# Patient Record
Sex: Male | Born: 1980 | Race: Black or African American | Hispanic: No | Marital: Single | Smoking: Current every day smoker
Health system: Southern US, Community
[De-identification: ages and names within clinical notes are randomized; demographics above are authoritative.]

---

## 2011-10-15 ENCOUNTER — Emergency Department (HOSPITAL_BASED_OUTPATIENT_CLINIC_OR_DEPARTMENT_OTHER)
Admission: EM | Admit: 2011-10-15 | Discharge: 2011-10-15 | Disposition: A | Discharge status: Home / Self Care | Payer: Self-pay | Attending: Emergency Medicine | Admitting: Emergency Medicine

## 2011-10-15 ENCOUNTER — Encounter (HOSPITAL_BASED_OUTPATIENT_CLINIC_OR_DEPARTMENT_OTHER): Payer: Self-pay

## 2011-10-15 ENCOUNTER — Emergency Department (INDEPENDENT_AMBULATORY_CARE_PROVIDER_SITE_OTHER): Payer: Self-pay

## 2011-10-15 DIAGNOSIS — R05 Cough: Secondary | ICD-10-CM | POA: Insufficient documentation

## 2011-10-15 DIAGNOSIS — R509 Fever, unspecified: Secondary | ICD-10-CM

## 2011-10-15 DIAGNOSIS — R220 Localized swelling, mass and lump, head: Secondary | ICD-10-CM | POA: Insufficient documentation

## 2011-10-15 DIAGNOSIS — R059 Cough, unspecified: Secondary | ICD-10-CM

## 2011-10-15 DIAGNOSIS — F17200 Nicotine dependence, unspecified, uncomplicated: Secondary | ICD-10-CM | POA: Insufficient documentation

## 2011-10-15 DIAGNOSIS — J3489 Other specified disorders of nose and nasal sinuses: Secondary | ICD-10-CM | POA: Insufficient documentation

## 2011-10-15 DIAGNOSIS — R221 Localized swelling, mass and lump, neck: Secondary | ICD-10-CM | POA: Insufficient documentation

## 2011-10-15 DIAGNOSIS — J069 Acute upper respiratory infection, unspecified: Secondary | ICD-10-CM | POA: Insufficient documentation

## 2011-10-15 MED ORDER — HYDROCODONE-ACETAMINOPHEN 7.5-500 MG/15ML PO SOLN: 15.0000 mL | Freq: Four times a day (QID) | ORAL | Status: AC | PRN

## 2011-10-15 MED ORDER — ALBUTEROL SULFATE HFA 108 (90 BASE) MCG/ACT IN AERS
2.0000 | INHALATION_SPRAY | RESPIRATORY_TRACT | Status: DC | PRN
  Administered 2011-10-15: 2 via RESPIRATORY_TRACT
  Filled 2011-10-15: qty 6.7

## 2011-10-15 NOTE — ED Notes (Signed)
Fever, prod cough x 2 weeks-smoker

## 2011-10-15 NOTE — ED Provider Notes (Signed)
History     CSN: [id number]  Arrival date & time 10/15/11  1217   First MD Initiated Contact with Patient 10/15/11 1227      Chief Complaint  Patient presents with  . Cough    (Consider location/radiation/quality/duration/timing/severity/associated sxs/prior treatment) Patient is a 31 y.o. male presenting with cough. The history is provided by the patient.  Cough This is a new problem. Episode onset: 2 weeks ago. The problem occurs constantly. The problem has been gradually improving. The cough is productive of sputum. Maximum temperature: Had a fever last week but since resolved. Associated symptoms include rhinorrhea. Pertinent negatives include no chest pain, no sweats, no ear pain, no sore throat, no shortness of breath and no wheezing. He has tried decongestants and cough syrup for the symptoms. The treatment provided no relief. He is a smoker. His past medical history does not include COPD or asthma.    History reviewed. No pertinent past medical history.  History reviewed. No pertinent past surgical history.  No family history on file.  History  Substance Use Topics  . Smoking status: Current Everyday Smoker  . Smokeless tobacco: Not on file  . Alcohol Use: No      Review of Systems  HENT: Positive for rhinorrhea. Negative for ear pain and sore throat.   Respiratory: Positive for cough. Negative for shortness of breath and wheezing.   Cardiovascular: Negative for chest pain.  All other systems reviewed and are negative.    Allergies  Review of patient's allergies indicates no known allergies.  Home Medications   Current Outpatient Rx  Name Route Sig Dispense Refill  . TYLENOL COLD MULTI-SYMPTOM PO Oral Take by mouth.      BP 125/75  Pulse 91  Temp(Src) 97.7 F (36.5 C) (Oral)  Resp 16  Ht 5\' 8"  (1.727 m)  Wt 125 lb (56.7 kg)  BMI 19.01 kg/m2  SpO2 100%  Physical Exam  Nursing note and vitals reviewed. Constitutional: He is oriented to person,  place, and time. He appears well-developed and well-nourished. No distress.  HENT:  Head: Normocephalic and atraumatic.  Nose: Mucosal edema and rhinorrhea present.  Mouth/Throat: Mucous membranes are normal. Posterior oropharyngeal erythema present. No oropharyngeal exudate or posterior oropharyngeal edema.  Eyes: Conjunctivae and EOM are normal. Pupils are equal, round, and reactive to light.  Neck: Normal range of motion. Neck supple.  Cardiovascular: Normal rate, regular rhythm and intact distal pulses.   No murmur heard. Pulmonary/Chest: Effort normal and breath sounds normal. No respiratory distress. He has no wheezes. He has no rales.  Abdominal: Soft. He exhibits no distension. There is no tenderness. There is no rebound and no guarding.  Musculoskeletal: Normal range of motion. He exhibits no edema and no tenderness.  Neurological: He is alert and oriented to person, place, and time.  Skin: Skin is warm and dry. No rash noted. No erythema.  Psychiatric: He has a normal mood and affect. His behavior is normal.    ED Course  Procedures (including critical care time)  Labs Reviewed - No data to display Dg Chest 2 View  10/15/2011  *RADIOLOGY REPORT*  Clinical Data: Fever and cough.  CHEST - 2 VIEW  Comparison:  None.  Findings:  The heart size and mediastinal contours are within normal limits.  Both lungs are clear.  The visualized skeletal structures are unremarkable.  IMPRESSION: No active cardiopulmonary disease.  Original Report Authenticated By: Danae Orleans, M.D.     No diagnosis found.  MDM   Pt with symptoms consistent with viral URI for last 2 weeks with persistent cough.  Well appearing here.  No signs of breathing difficulty  No signs of pharyngitis, otitis or abnormal abdominal findings.   CXR wnl and pt to return with any further problems.  Given inhaler for cough and no hx of seasonal allergies but will try zyrtec for nasal drainage.         Gwyneth Sprout, MD 10/15/11 1324

## 2012-04-20 ENCOUNTER — Encounter (HOSPITAL_BASED_OUTPATIENT_CLINIC_OR_DEPARTMENT_OTHER): Payer: Self-pay | Admitting: *Deleted

## 2012-04-20 ENCOUNTER — Emergency Department (HOSPITAL_BASED_OUTPATIENT_CLINIC_OR_DEPARTMENT_OTHER)
Admission: EM | Admit: 2012-04-20 | Discharge: 2012-04-20 | Disposition: A | Discharge status: Home Health | Payer: Self-pay | Attending: Emergency Medicine | Admitting: Emergency Medicine

## 2012-04-20 DIAGNOSIS — A6000 Herpesviral infection of urogenital system, unspecified: Secondary | ICD-10-CM | POA: Insufficient documentation

## 2012-04-20 DIAGNOSIS — B009 Herpesviral infection, unspecified: Secondary | ICD-10-CM

## 2012-04-20 DIAGNOSIS — F17200 Nicotine dependence, unspecified, uncomplicated: Secondary | ICD-10-CM | POA: Insufficient documentation

## 2012-04-20 MED ORDER — ACYCLOVIR 200 MG PO CAPS: 200.0000 mg | ORAL_CAPSULE | Freq: Every day | ORAL | Status: DC

## 2012-04-20 NOTE — ED Provider Notes (Signed)
History     CSN: [id number]  Arrival date & time 04/20/12  1500   First MD Initiated Contact with Patient 04/20/12 1545      Chief Complaint  Patient presents with  . Rash    (Consider location/radiation/quality/duration/timing/severity/associated sxs/prior treatment) Patient is a 31 y.o. male presenting with rash. The history is provided by the patient. No language interpreter was used.  Rash  This is a new problem. The current episode started more than 1 week ago. The problem has not changed (pt reports areas come and go) since onset.The problem is associated with nothing. There has been no fever. The rash is present on the genitalia. The pain is at a severity of 4/10. The patient is experiencing no pain. Associated symptoms include blisters.    History reviewed. No pertinent past medical history.  History reviewed. No pertinent past surgical history.  No family history on file.  History  Substance Use Topics  . Smoking status: Current Every Day Smoker -- 0.5 packs/day    Types: Cigarettes  . Smokeless tobacco: Not on file  . Alcohol Use: Yes      Review of Systems  Skin: Positive for rash.  All other systems reviewed and are negative.    Allergies  Review of patient's allergies indicates no known allergies.  Home Medications   Current Outpatient Rx  Name Route Sig Dispense Refill  . TYLENOL COLD MULTI-SYMPTOM PO Oral Take by mouth.      BP 123/76  Pulse 88  Temp 98.2 F (36.8 C) (Oral)  Resp 16  Ht 5\' 8"  (1.727 m)  Wt 125 lb (56.7 kg)  BMI 19.01 kg/m2  SpO2 100%  Physical Exam  Nursing note and vitals reviewed. Constitutional: He appears well-developed and well-nourished.  HENT:  Head: Normocephalic and atraumatic.  Genitourinary: Penile tenderness present.       Scattered blister, dried healed sore penis,  No discharge  Musculoskeletal: Normal range of motion.  Neurological: He is alert.  Skin: Skin is warm.  Psychiatric: He has a normal  mood and affect.    ED Course  Procedures (including critical care time)   Labs Reviewed  HERPES SIMPLEX VIRUS CULTURE  RPR   No results found.   No diagnosis found.    MDM  rpr ordered,   I suspect herpes,   Herpes cultured obtained        Lonia Skinner [place], [place] 04/20/12 1627

## 2012-04-20 NOTE — ED Notes (Signed)
States he has bumps on his "privates" that go away and come back.

## 2012-04-21 NOTE — ED Provider Notes (Signed)
Medical screening examination/treatment/procedure(s) were performed by non-physician practitioner and as supervising physician I was immediately available for consultation/collaboration.   Carleene Cooper III, MD 04/21/12 [phone]

## 2012-04-22 LAB — HERPES SIMPLEX VIRUS CULTURE: Special Requests: NORMAL

## 2012-04-23 NOTE — ED Notes (Signed)
+   Herpes-patient treated appropriately.

## 2012-04-24 NOTE — ED Notes (Signed)
Unable to contact via phone.'Letter sent to [facility] address. 

## 2012-05-07 ENCOUNTER — Emergency Department (HOSPITAL_BASED_OUTPATIENT_CLINIC_OR_DEPARTMENT_OTHER)
Admission: EM | Admit: 2012-05-07 | Discharge: 2012-05-07 | Disposition: A | Discharge status: Home / Self Care | Payer: Self-pay | Attending: Emergency Medicine | Admitting: Emergency Medicine

## 2012-05-07 ENCOUNTER — Encounter (HOSPITAL_BASED_OUTPATIENT_CLINIC_OR_DEPARTMENT_OTHER): Payer: Self-pay | Admitting: Emergency Medicine

## 2012-05-07 DIAGNOSIS — Z79899 Other long term (current) drug therapy: Secondary | ICD-10-CM | POA: Insufficient documentation

## 2012-05-07 DIAGNOSIS — K089 Disorder of teeth and supporting structures, unspecified: Secondary | ICD-10-CM | POA: Insufficient documentation

## 2012-05-07 DIAGNOSIS — K0889 Other specified disorders of teeth and supporting structures: Secondary | ICD-10-CM

## 2012-05-07 DIAGNOSIS — F17200 Nicotine dependence, unspecified, uncomplicated: Secondary | ICD-10-CM | POA: Insufficient documentation

## 2012-05-07 MED ORDER — HYDROCODONE-ACETAMINOPHEN 5-325 MG PO TABS: 1.0000 | ORAL_TABLET | ORAL | Status: DC | PRN

## 2012-05-07 MED ORDER — PENICILLIN V POTASSIUM 500 MG PO TABS: 500.0000 mg | ORAL_TABLET | Freq: Two times a day (BID) | ORAL | Status: AC

## 2012-05-07 MED ORDER — PENICILLIN V POTASSIUM 500 MG PO TABS: 500.0000 mg | ORAL_TABLET | Freq: Two times a day (BID) | ORAL | Status: DC

## 2012-05-07 MED ORDER — IBUPROFEN 600 MG PO TABS: 600.0000 mg | ORAL_TABLET | Freq: Four times a day (QID) | ORAL | Status: DC | PRN

## 2012-05-07 MED ORDER — HYDROCODONE-ACETAMINOPHEN 5-325 MG PO TABS
1.0000 | ORAL_TABLET | Freq: Once | ORAL | Status: AC
  Administered 2012-05-07: 1 via ORAL
  Filled 2012-05-07: qty 1

## 2012-05-07 NOTE — ED Notes (Signed)
Pt's prescriptions were sent to the [facility] pharmacy. Pt did not go to pharmacy to pick up rx until after pharmacy had close. Pt's rxs were re-printed and given to pt. Message left for pharmacist to cancel the rx they have there.

## 2012-05-07 NOTE — ED Provider Notes (Signed)
History     CSN: [id number]  Arrival date & time 05/07/12  4098   First MD Initiated Contact with Patient 05/07/12 1829      Chief Complaint  Patient presents with  . Dental Pain    (Consider location/radiation/quality/duration/timing/severity/associated sxs/prior treatment) HPI Pt with bl lower molar pain and gingival swelling worsening over the past few days. No fever, chills, neck pain. Pt has not seen dentist History reviewed. No pertinent past medical history.  History reviewed. No pertinent past surgical history.  No family history on file.  History  Substance Use Topics  . Smoking status: Current Every Day Smoker -- 0.5 packs/day    Types: Cigarettes  . Smokeless tobacco: Not on file  . Alcohol Use: Yes      Review of Systems  Constitutional: Negative for fever and chills.  HENT: Positive for dental problem. Negative for sore throat, facial swelling, trouble swallowing, neck pain and neck stiffness.     Allergies  Review of patient's allergies indicates no known allergies.  Home Medications   Current Outpatient Rx  Name  Route  Sig  Dispense  Refill  . ACYCLOVIR 200 MG PO CAPS   Oral   Take 1 capsule (200 mg total) by mouth 5 (five) times daily.   50 capsule   1   . HYDROCODONE-ACETAMINOPHEN 5-325 MG PO TABS   Oral   Take 1 tablet by mouth every 4 (four) hours as needed for pain.   10 tablet   0   . IBUPROFEN 600 MG PO TABS   Oral   Take 1 tablet (600 mg total) by mouth every 6 (six) hours as needed for pain.   30 tablet   0   . PENICILLIN V POTASSIUM 500 MG PO TABS   Oral   Take 1 tablet (500 mg total) by mouth 2 (two) times daily.   20 tablet   0   . TYLENOL COLD MULTI-SYMPTOM PO   Oral   Take by mouth.           BP 130/81  Pulse 79  Temp 98.4 F (36.9 C) (Oral)  Resp 16  Ht 5\' 8"  (1.727 m)  Wt 130 lb (58.968 kg)  BMI 19.77 kg/m2  SpO2 100%  Physical Exam  Nursing note and vitals reviewed. Constitutional: He is  oriented to person, place, and time. He appears well-developed and well-nourished. No distress.  HENT:  Head: Normocephalic and atraumatic.  Mouth/Throat: Oropharynx is clear and moist.    Eyes: EOM are normal. Pupils are equal, round, and reactive to light.  Neck: Normal range of motion. Neck supple.  Pulmonary/Chest: Effort normal.  Abdominal: Soft. Bowel sounds are normal.  Musculoskeletal: Normal range of motion. He exhibits no edema and no tenderness.  Lymphadenopathy:    He has no cervical adenopathy.  Neurological: He is alert and oriented to person, place, and time.  Skin: Skin is warm and dry. No rash noted. No erythema.  Psychiatric: He has a normal mood and affect. His behavior is normal.    ED Course  Procedures (including critical care time)  Labs Reviewed - No data to display No results found.   1. Pain, dental       MDM  No definite abscess present. F/u with dentist.         Loren Racer, MD 05/07/12 [phone]

## 2012-05-07 NOTE — ED Notes (Signed)
Broken teeth on bottom, both sides.

## 2012-07-02 ENCOUNTER — Emergency Department (HOSPITAL_BASED_OUTPATIENT_CLINIC_OR_DEPARTMENT_OTHER)
Admission: EM | Admit: 2012-07-02 | Discharge: 2012-07-02 | Disposition: A | Discharge status: Home / Self Care | Payer: Self-pay | Attending: Emergency Medicine | Admitting: Emergency Medicine

## 2012-07-02 ENCOUNTER — Encounter (HOSPITAL_BASED_OUTPATIENT_CLINIC_OR_DEPARTMENT_OTHER): Payer: Self-pay | Admitting: *Deleted

## 2012-07-02 DIAGNOSIS — F17200 Nicotine dependence, unspecified, uncomplicated: Secondary | ICD-10-CM | POA: Insufficient documentation

## 2012-07-02 DIAGNOSIS — Z79899 Other long term (current) drug therapy: Secondary | ICD-10-CM | POA: Insufficient documentation

## 2012-07-02 DIAGNOSIS — A6000 Herpesviral infection of urogenital system, unspecified: Secondary | ICD-10-CM

## 2012-07-02 MED ORDER — ACYCLOVIR 800 MG PO TABS: 800.0000 mg | ORAL_TABLET | Freq: Two times a day (BID) | ORAL | Status: AC

## 2012-07-02 NOTE — ED Notes (Signed)
Recurrent lesions on penis has prescription of acyclovir wants refilled had filled 06/05/2012

## 2012-07-02 NOTE — ED Provider Notes (Signed)
History     CSN: [id number]  Arrival date & time 07/02/12  4098   First MD Initiated Contact with Patient 07/02/12 0848      Chief Complaint  Patient presents with  . Rash    (Consider location/radiation/quality/duration/timing/severity/associated sxs/prior treatment) HPI Pt with history of herpes genitalis reports outbreak for the last 2 days with sores on shaft of penis and suprapubic area. He has previously taken acyclovir with good results. Denies penile discharge.   History reviewed. No pertinent past medical history.  History reviewed. No pertinent past surgical history.  History reviewed. No pertinent family history.  History  Substance Use Topics  . Smoking status: Current Every Day Smoker -- 0.5 packs/day    Types: Cigarettes  . Smokeless tobacco: Not on file  . Alcohol Use: Yes      Review of Systems All other systems reviewed and are negative except as noted in HPI.   Allergies  Review of patient's allergies indicates no known allergies.  Home Medications   Current Outpatient Rx  Name  Route  Sig  Dispense  Refill  . ACYCLOVIR 200 MG PO CAPS   Oral   Take 1 capsule (200 mg total) by mouth 5 (five) times daily.   50 capsule   1   . HYDROCODONE-ACETAMINOPHEN 5-325 MG PO TABS   Oral   Take 1 tablet by mouth every 4 (four) hours as needed for pain.   10 tablet   0   . IBUPROFEN 600 MG PO TABS   Oral   Take 1 tablet (600 mg total) by mouth every 6 (six) hours as needed for pain.   30 tablet   0   . TYLENOL COLD MULTI-SYMPTOM PO   Oral   Take by mouth.           BP 129/77  Pulse 70  Temp 97.8 F (36.6 C) (Oral)  Resp 16  Ht 5\' 8"  (1.727 m)  Wt 130 lb (58.968 kg)  BMI 19.77 kg/m2  SpO2 99%  Physical Exam  Nursing note and vitals reviewed. Constitutional: He is oriented to person, place, and time. He appears well-developed and well-nourished.  HENT:  Head: Normocephalic and atraumatic.  Eyes: EOM are normal. Pupils are equal,  round, and reactive to light.  Neck: Normal range of motion. Neck supple.  Cardiovascular: Normal rate, normal heart sounds and intact distal pulses.   Pulmonary/Chest: Effort normal and breath sounds normal.  Abdominal: Bowel sounds are normal. He exhibits no distension. There is no tenderness.  Genitourinary:       Small area of herpetic rash on L shaft and pubic region, no discharge; no testicular tenderness/swelling  Musculoskeletal: Normal range of motion. He exhibits no edema and no tenderness.  Neurological: He is alert and oriented to person, place, and time. He has normal strength. No cranial nerve deficit or sensory deficit.  Skin: Skin is warm and dry. No rash noted.  Psychiatric: He has a normal mood and affect.    ED Course  Procedures (including critical care time)  Labs Reviewed - No data to display No results found.   No diagnosis found.    MDM  Acyclovir refill, advised safe sex practices        Odella Appelhans B. Bernette Mayers, MD 07/02/12 [phone]

## 2014-07-08 ENCOUNTER — Encounter (HOSPITAL_BASED_OUTPATIENT_CLINIC_OR_DEPARTMENT_OTHER): Payer: Self-pay | Admitting: Emergency Medicine

## 2014-07-08 ENCOUNTER — Emergency Department (HOSPITAL_BASED_OUTPATIENT_CLINIC_OR_DEPARTMENT_OTHER)
Admission: EM | Admit: 2014-07-08 | Discharge: 2014-07-08 | Disposition: A | Discharge status: Home / Self Care | Payer: Self-pay | Attending: Emergency Medicine | Admitting: Emergency Medicine

## 2014-07-08 DIAGNOSIS — D170 Benign lipomatous neoplasm of skin and subcutaneous tissue of head, face and neck: Secondary | ICD-10-CM | POA: Insufficient documentation

## 2014-07-08 DIAGNOSIS — Z79899 Other long term (current) drug therapy: Secondary | ICD-10-CM | POA: Insufficient documentation

## 2014-07-08 DIAGNOSIS — Z720 Tobacco use: Secondary | ICD-10-CM | POA: Insufficient documentation

## 2014-07-08 MED ORDER — SULFAMETHOXAZOLE-TRIMETHOPRIM 800-160 MG PO TABS: 1.0000 | ORAL_TABLET | Freq: Two times a day (BID) | ORAL | Status: AC

## 2014-07-08 NOTE — ED Provider Notes (Signed)
CSN: [id number]     Arrival date & time 07/08/14  0930 History   First MD Initiated Contact with Patient 07/08/14 0957     Chief Complaint  Patient presents with  . Skin Problem     (Consider location/radiation/quality/duration/timing/severity/associated sxs/prior Treatment) HPI Comments: Patient is a 34 year old male who presents with complaints of swelling to his forehead.he noticed a large area of swelling to the right side of his forehead one month ago that occurred in the absence of any injury or trauma. This has since become smaller in size but is noticeable. He is having no pain or discomfort, but is concerned about the cosmetic appearance. He denies fevers or chills. He denies any other complaint.  The history is provided by the patient.    No past medical history on file. No past surgical history on file. No family history on file. History  Substance Use Topics  . Smoking status: Current Every Day Smoker -- 0.50 packs/day    Types: Cigarettes  . Smokeless tobacco: Not on file  . Alcohol Use: Yes    Review of Systems  All other systems reviewed and are negative.     Allergies  Review of patient's allergies indicates no known allergies.  Home Medications   Prior to Admission medications   Medication Sig Start Date End Date Taking? Authorizing Provider  acyclovir (ZOVIRAX) 200 MG capsule Take 1 capsule (200 mg total) by mouth 5 (five) times daily. 04/20/12   Fransico Meadow, PA-C  HYDROcodone-acetaminophen (NORCO/VICODIN) 5-325 MG per tablet Take 1 tablet by mouth every 4 (four) hours as needed for pain. 05/07/12   Fransico Meadow, PA-C  ibuprofen (ADVIL,MOTRIN) 600 MG tablet Take 1 tablet (600 mg total) by mouth every 6 (six) hours as needed for pain. 05/07/12   Fransico Meadow, PA-C  Phenyleph-CPM-DM-APAP (TYLENOL COLD MULTI-SYMPTOM PO) Take by mouth.    Historical Provider, MD   BP 123/82 mmHg  Pulse 67  Temp(Src) 98.2 F (36.8 C) (Oral)  Resp 16  Ht 5\' 8"   (1.727 m)  Wt 128 lb (58.06 kg)  BMI 19.47 kg/m2  SpO2 100% Physical Exam  Constitutional: He is oriented to person, place, and time. He appears well-developed and well-nourished. No distress.  HENT:  Head: Normocephalic and atraumatic.  There is a 1 cm round rubbery lesion to the right mid forehead. This is freely mobile within the soft tissues and not fixed.  Eyes: EOM are normal. Pupils are equal, round, and reactive to light.  Neck: Normal range of motion. Neck supple.  Neurological: He is alert and oriented to person, place, and time.  Skin: Skin is warm and dry. He is not diaphoretic.  Nursing note and vitals reviewed.   ED Course  Procedures (including critical care time) Labs Review Labs Reviewed - No data to display  Imaging Review No results found.   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 This will be treated with Bactrim and when necessary follow-up with [facility] [place] [facility] if not improving.    Veryl Speak, MD 07/08/14 1012

## 2014-07-08 NOTE — Discharge Instructions (Signed)
Bactrim as prescribed.  If this is not improving in the next week, follow-up with [facility] to discuss removal of the lesion.   Lipoma A lipoma is a noncancerous (benign) tumor composed of fat cells. They are usually found under the skin (subcutaneous). A lipoma may occur in any tissue of the body that contains fat. Common areas for lipomas to appear include the back, shoulders, buttocks, and thighs. Lipomas are a very common soft tissue growth. They are soft and grow slowly. Most problems caused by a lipoma depend on where it is growing. DIAGNOSIS  A lipoma can be diagnosed with a physical exam. These tumors rarely become cancerous, but radiographic studies can help determine this for certain. Studies used may include:  Computerized X-ray scans (CT or CAT scan).  Computerized magnetic scans (MRI). TREATMENT  Small lipomas that are not causing problems may be watched. If a lipoma continues to enlarge or causes problems, removal is often the best treatment. Lipomas can also be removed to improve appearance. Surgery is done to remove the fatty cells and the surrounding capsule. Most often, this is done with medicine that numbs the area (local anesthetic). The removed tissue is examined under a microscope to make sure it is not cancerous. Keep all follow-up appointments with your caregiver. SEEK MEDICAL CARE IF:   The lipoma becomes larger or hard.  The lipoma becomes painful, red, or increasingly swollen. These could be signs of infection or a more serious condition. Document Released: 05/31/2002 Document Revised: 09/02/2011 Document Reviewed: 11/10/2009 [facility] Patient Information 2015 [place], [place]. This information is not intended to replace advice given to you by your health care provider. Make sure you discuss any questions you have with your health care provider.

## 2014-07-08 NOTE — ED Notes (Signed)
Pt has small nodule under skin on forehead for over 1 month.  Pt denies pain.  Nodule is movable.

## 2014-07-27 ENCOUNTER — Encounter (HOSPITAL_BASED_OUTPATIENT_CLINIC_OR_DEPARTMENT_OTHER): Payer: Self-pay | Admitting: *Deleted

## 2014-07-27 ENCOUNTER — Emergency Department (HOSPITAL_BASED_OUTPATIENT_CLINIC_OR_DEPARTMENT_OTHER)
Admission: EM | Admit: 2014-07-27 | Discharge: 2014-07-27 | Disposition: A | Discharge status: Home / Self Care | Payer: Self-pay | Attending: Emergency Medicine | Admitting: Emergency Medicine

## 2014-07-27 DIAGNOSIS — Z720 Tobacco use: Secondary | ICD-10-CM | POA: Insufficient documentation

## 2014-07-27 DIAGNOSIS — R51 Headache: Secondary | ICD-10-CM | POA: Insufficient documentation

## 2014-07-27 DIAGNOSIS — K0889 Other specified disorders of teeth and supporting structures: Secondary | ICD-10-CM

## 2014-07-27 DIAGNOSIS — K088 Other specified disorders of teeth and supporting structures: Secondary | ICD-10-CM | POA: Insufficient documentation

## 2014-07-27 MED ORDER — OXYCODONE-ACETAMINOPHEN 5-325 MG PO TABS: 1.0000 | ORAL_TABLET | Freq: Four times a day (QID) | ORAL | Status: DC | PRN

## 2014-07-27 MED ORDER — PENICILLIN V POTASSIUM 500 MG PO TABS: 500.0000 mg | ORAL_TABLET | Freq: Three times a day (TID) | ORAL | Status: DC

## 2014-07-27 NOTE — ED Provider Notes (Signed)
CSN: [id number]     Arrival date & time 07/27/14  1829 History  This chart was scribed for John Beards, MD by Tula Nakayama, ED Scribe. This patient was seen in room MH01/MH01 and the patient's care was started at 6:25 PM.    Chief Complaint  Patient presents with  . Dental Pain    Patient is a 34 y.o. male presenting with tooth pain. The history is provided by the patient. No language interpreter was used.  Dental Pain Location:  Upper Upper teeth location: pre-molar. Quality:  Unable to specify Severity:  Moderate Onset quality:  Gradual Duration:  1 day Timing:  Constant Progression:  Unchanged Chronicity:  New Context: not trauma   Relieved by:  Nothing Worsened by:  Pressure Ineffective treatments:  NSAIDs Associated symptoms: facial swelling and headaches   Associated symptoms: no fever     HPI Comments: Evertt Chouinard is a 34 y.o. male with no chronic medical history who presents to the Emergency Department complaining of constant, moderate left upper dental pain that started yesterday. Pt states left-sided buccal swelling and HA as associated symptoms. He has tried Ibuprofen with no relief. Pt denies recent trauma or injury.  He states that he typically has to chew on his right side because he has teeth on his left side that need to be pulled. Pt denies fever as an associated symptom.  History reviewed. No pertinent past medical history. History reviewed. No pertinent past surgical history. History reviewed. No pertinent family history. History  Substance Use Topics  . Smoking status: Current Every Day Smoker -- 0.50 packs/day    Types: Cigarettes  . Smokeless tobacco: Not on file  . Alcohol Use: Yes    Review of Systems  Constitutional: Negative for fever.  HENT: Positive for dental problem and facial swelling.   Neurological: Positive for headaches.   Allergies  Review of patient's allergies indicates no known allergies.  Home Medications   Prior to  Admission medications   Medication Sig Start Date End Date Taking? Authorizing Provider  oxyCODONE-acetaminophen (PERCOCET/ROXICET) 5-325 MG per tablet Take 1-2 tablets by mouth every 6 (six) hours as needed for severe pain. 07/27/14   John Beards, MD  penicillin v potassium (VEETID) 500 MG tablet Take 1 tablet (500 mg total) by mouth 3 (three) times daily. 07/27/14   John Beards, MD   BP 124/83 mmHg  Pulse 76  Temp(Src) 98.6 F (37 C)  Resp 18  SpO2 100%   Physical Exam  Constitutional: He appears well-developed and well-nourished. No distress.  HENT:  Head: Normocephalic and atraumatic.  Mild gingival redness and swelling of left upper premolar; no significant lymphadenopathy  Eyes: Conjunctivae and EOM are normal.  Neck: Neck supple. No tracheal deviation present.  Cardiovascular: Normal rate, regular rhythm and normal heart sounds.   Pulmonary/Chest: Effort normal and breath sounds normal. No respiratory distress.  Lungs clear bilaterally  Lymphadenopathy:    He has no cervical adenopathy.  Skin: Skin is warm and dry.  Psychiatric: He has a normal mood and affect. His behavior is normal.  Nursing note and vitals reviewed.   ED Course  Procedures (including critical care time) DIAGNOSTIC STUDIES: Oxygen Saturation is 100% on RA, normal by my interpretation.    COORDINATION OF CARE: 6:40 PM Discussed treatment plan with pt at bedside and pt agreed to plan. Advised pt to follow up with dentist.  Labs Review Labs Reviewed - No data to display  Imaging Review No results  found.   EKG Interpretation None      MDM   Final diagnoses:  Pain, dental   Pt presenting with pain in left upper gumline/premolar, some swelling and erythema noted.  No swelling under tongue.  Pt started on abx and pain medication, he was given information for dental followup and encouraged to call tomorrow morning.  Discharged with strict return precautions.  Pt agreeable with plan.   I  personally performed the services described in this documentation, which was scribed in my presence. The recorded information has been reviewed and is accurate.     John Beards, MD 07/27/14 [place]

## 2014-07-27 NOTE — ED Notes (Signed)
Pt c/o dental pain x 2 days.  

## 2014-07-27 NOTE — Discharge Instructions (Signed)
Return to the ED with any concerns including increased pain, vomiting and not able to keep down antibiotics, difficulty breathing or swallowing, decreased level of alertness/lethargy, or any other alarming symptoms

## 2015-08-09 ENCOUNTER — Emergency Department (HOSPITAL_BASED_OUTPATIENT_CLINIC_OR_DEPARTMENT_OTHER)
Admission: EM | Admit: 2015-08-09 | Discharge: 2015-08-09 | Disposition: A | Discharge status: Home / Self Care | Payer: Self-pay | Attending: Emergency Medicine | Admitting: Emergency Medicine

## 2015-08-09 ENCOUNTER — Encounter (HOSPITAL_BASED_OUTPATIENT_CLINIC_OR_DEPARTMENT_OTHER): Payer: Self-pay | Admitting: *Deleted

## 2015-08-09 DIAGNOSIS — F17210 Nicotine dependence, cigarettes, uncomplicated: Secondary | ICD-10-CM | POA: Insufficient documentation

## 2015-08-09 DIAGNOSIS — Z880 Allergy status to penicillin: Secondary | ICD-10-CM | POA: Insufficient documentation

## 2015-08-09 DIAGNOSIS — R220 Localized swelling, mass and lump, head: Secondary | ICD-10-CM | POA: Insufficient documentation

## 2015-08-09 NOTE — ED Notes (Signed)
States he has a knot on head he was seen here about a year ago and went for follow up recently and was told to come back here. Swelling is on right forehead. Dx a year ago with a fatty tissue area and placed on prednisone but it did not help. No pain.

## 2015-08-09 NOTE — Discharge Instructions (Signed)
You have been seen today for a scalp mass. Follow-up with plastic surgery for removal as soon as possible. Call their office to make an appointment. Follow up with PCP as needed. Return to ED should symptoms worsen.   Emergency Department Resource Guide 1) Find a Doctor and Pay Out of Pocket Although you won't have to find out who is covered by your insurance plan, it is a good idea to ask around and get recommendations. You will then need to call the office and see if the doctor you have chosen will accept you as a new patient and what types of options they offer for patients who are self-pay. Some doctors offer discounts or will set up payment plans for their patients who do not have insurance, but you will need to ask so you aren't surprised when you get to your appointment.  2) Contact Your Local Health Department Not all health departments have doctors that can see patients for sick visits, but many do, so it is worth a call to see if yours does. If you don't know where your local health department is, you can check in your phone book. The CDC also has a tool to help you locate your state's health department, and many state websites also have listings of all of their local health departments.  3) Find a [facility] If your illness is not likely to be very severe or complicated, you may want to try a walk in clinic. These are popping up all over the country in pharmacies, drugstores, and shopping centers. They're usually staffed by nurse practitioners or physician assistants that have been trained to treat common illnesses and complaints. They're usually fairly quick and inexpensive. However, if you have serious medical issues or chronic medical problems, these are probably not your best option.  No Primary Care Doctor: - Call Health Connect at  [phone] - they can help you locate a primary care doctor that  accepts your insurance, provides certain services, etc. - Physician Referral Service-  [phone]  Chronic Pain Problems: Organization         Address  Phone   Notes  [facility]  [phone] Patients need to be referred by their primary care doctor.   Medication Assistance: Organization         Address  Phone   Notes  [facility] Medication [facility] [address] [place], [place] [address] [phone] --Must be a resident of [facility] -- Must have NO insurance coverage whatsoever (no Medicaid/ Medicare, etc.) -- The pt. MUST have a primary care doctor that directs their care regularly and follows them in the community   MedAssist  [phone]   [employer]  [phone]    Agencies that provide inexpensive medical care: Organization         Address  Phone   Notes  [place]  [phone]   [facility]    [phone]   [facility], [place] [address] [phone]   [place] [address], [place] [phone]   Planned Parenthood    [phone]   [facility]    [phone]   [place] and [address], [place] Phone:  [phone], Fax:  [phone] Hours of Operation:  9 am - 6 pm, M-F.  Also accepts Medicaid/Medicare and self-pay.  [facility] for [place] [address], [address], [place] Phone: [phone], Fax: [phone]. Hours of Operation:  8:30 am - 5:30 pm, M-F.  Also accepts Medicaid and self-pay.  [facility] [place] [address] [place] Phone: [phone]   [place], [place], [place] [phone], Ext. [phone] Mondays & Thursdays: 7-9 AM.  First 15 patients are seen on a first come, first serve basis.    [place] Providers:  Organization         Address  Phone   Notes  [facility] [address],  [place] [phone] Also accepts self-pay patients.  [facility] [id number] [place], [place]  [phone]   [place], [address], [place] [phone]   [facility] [address] [place] [phone]   Lucianne Lei [address], [place]   [phone] Only accepts [place] Access [place] patients after they have their name applied to their card.   Self-Pay (no insurance) in [facility]:  Organization         Address  Phone   Notes  Sickle Cell Patients, [facility] [place] [phone]   [facility] [place] [phone]   [facility] [place]  [place], [place], [place] [phone]   [facility]/Dr. Osei-Bonsu  [address] [place] [address] [place] or [address], [place] [phone] Phone number for both [place] and [place] locations is the same.  Urgent Medical and [facility] [address], [place] [phone]   [facility] [address] [place] or [address] [phone]   [facility] [address], [place] [phone], phone; [phone], fax Sees patients 1st and 3rd Saturday of every month.  Must not qualify for public or private insurance (i.e. Medicaid, Medicare, [place] Health Choice, Veterans' Benefits)  Household income should be no more than 200% of the poverty level The clinic cannot treat you if you are pregnant or think you are pregnant  Sexually transmitted diseases are not treated at the clinic.    Dental Care: Organization         Address  Phone  Notes  [facility] Department of [facility] [place] [phone] Accepts children up to age 81 who are enrolled in  [place] or [place]; pregnant women with a Medicaid card; and children who have applied for Medicaid or [place] Health Choice, but were declined, whose parents can pay a reduced fee at time of service.  [facility] Department of [facility]  [address], [place] [phone] Accepts children up to age 44 who are enrolled in [place] or [place]; pregnant women with a Medicaid card; and children who have applied for Medicaid or [place] Health Choice, but were declined, whose parents can pay a reduced fee at time of service.  [facility] PRO[facility]  [place] [phone] Patients are seen by appointment only. Walk-ins are not accepted. [place] will see patients 52 years of age and older. Monday - Tuesday (8am-5pm) Most Wednesdays (8:30-5pm) $30 per  visit, cash only  [facility] Adult [employer] PROGRAM  [address], [address] [phone] Patients are seen by appointment only. Walk-ins are not accepted. [place] will see patients 48 years of age and older. One Wednesday Evening (Monthly: Volunteer Based).  $30 per visit, cash only  [place]  [phone] for adults; Children under age 62, call Graduate Pediatric Dentistry at [phone]. Children aged 41-14, please call [phone] to request a pediatric application.  Dental services are provided in all areas of dental care including fillings, crowns and bridges, complete and partial dentures, implants, gum treatment, root canals, and extractions. Preventive care is also provided. Treatment is provided to both adults and children. Patients are selected via a lottery and there is often a waiting list.   [facility] [address] [place]  [phone] [email or web address]   [facility] [address] [place], [place] [phone], Ext. [phone] Second and Fourth Thursday of each month, opens at 6:30  AM; Clinic ends at 9 AM.  Patients are seen on a first-come first-served basis, and a limited number are seen during each clinic.   [facility]  [address] [place], [place] [phone]   Eligibility Requirements You must have lived in [place], [place], or [place] counties for at least the last three months.   You cannot be eligible for state or federal sponsored [employer], including [employer], [place], or [employer].   You generally cannot be eligible for healthcare insurance through your employer.    How to apply: Eligibility screenings are held every Tuesday and Wednesday afternoon from 1:00 pm until 4:00 pm. You do not need an appointment for the interview!  [facility] [address], [place], [place]   [place]  [place] Department  [place]  [phone]    Behavioral Health Resources in the Community: Intensive Outpatient Programs Organization         Address  Phone  Notes  [place]. [address] [place], [place] [phone]   [facility] Outpatient [address] [place], [place]   ADS: Alcohol & Drug Svcs [address] [place], [place]   [place] [address]  [place], [place] or [phone]   Substance Abuse Resources Organization         Address  Phone  Notes  Alcohol and Drug Services  [phone]   [place]  [phone]   [facility] [place]   Chinita Pester  [phone]   Residential & Outpatient Substance Abuse Program  [phone]   Psychological Services Organization         Address  Phone  Notes  [facility] [place]  [place]  [phone]   [place] [address] [place] or  [phone]    Mobile Crisis Teams Organization         Address  Phone  Notes  [facility], Mobile Crisis Care Unit  [phone]   [facility]  [address] [place], [place]   [place] [address], [place] [phone]    Self-Help/Support Groups Organization         Address  Phone  Notes  [facility] of [place] - variety of support groups  [place] Call for more information  Narcotics Anonymous (NA), [facility] [address], [employer] [place]  2 meetings at this location   Special educational needs teacher         Address  Phone  Notes  [facility] Residential Treatment [place],    [place]  [phone]   [facility]  [address] [place] [id number], [place], [place]   [place], [place] [phone] Admissions: 8am-3pm M-F  Incentives Substance [place] [address]    [place], [place] [phone]   [facility] [address] [place], [place], [place]   [facility] [address],  [place], [place]   [facility] - Intensive Outpatient [address], [place], [place]   [facility] ([place] [address]  [place], [place] [phone] or [phone]   [facility] Services (RTS) [address] [place], [place] Accepts Medicaid  [facility] [place] [address],  [place] [phone] Substance Abuse/Addiction Treatment   [facility] Organization         Address  Phone  Notes  [facility]  [phone]   Domenic Schwab, PhD [address] [place]lis [place], [place]   [phone] or [phone]   [place] and [place], [place] [phone]   [facility] [address],  [place], [place] [phone] Insurance/Medicaid/sponsorship through [facility] and Families [address], [address] [place]                                    [place], [place] [phone] [place] [address] [place], [place] [phone]    Dr. Adele Schilder  [phone]   [facility] Dept. 1) [address], [place] 2) [place] 3)  [place] [address], [place] [phone]  [phone]   [place] [phone] or [phone] (After Hours)

## 2015-08-09 NOTE — ED Provider Notes (Signed)
CSN: [id number]     Arrival date & time 08/09/15  [id number] History   First MD Initiated Contact with Patient 08/09/15 0902     No chief complaint on file.    (Consider location/radiation/quality/duration/timing/severity/associated sxs/prior Treatment) HPI   John Bright is a 35 y.o. male, patient with no pertinent past medical history, presenting to the ED with complaint of a small mass to the right forehead that has been present for the last year and half. Pt has been seen previously for this issue, referred to primary care group, who gave him medication the patient cannot remember what was, and told him that if it continued to go back to the ED. Patient denies pain, neuro deficits, fever/chills, some nausea/vomiting, or any other complaints.    History reviewed. No pertinent past medical history. History reviewed. No pertinent past surgical history. No family history on file. Social History  Substance Use Topics  . Smoking status: Current Every Day Smoker -- 0.50 packs/day    Types: Cigarettes  . Smokeless tobacco: None  . Alcohol Use: Yes    Review of Systems  Constitutional: Negative for fever and chills.  Gastrointestinal: Negative for nausea and vomiting.  Skin: Negative for color change, pallor and wound.       Scalp mass  Neurological: Negative for dizziness, light-headedness and headaches.      Allergies  Penicillins  Home Medications   Prior to Admission medications   Not on File   BP 127/75 mmHg  Pulse 66  Temp(Src) 98.1 F (36.7 C) (Oral)  Resp 16  Ht 5\' 8"  (1.727 m)  Wt 65.772 kg  BMI 22.05 kg/m2  SpO2 99% Physical Exam  Constitutional: He appears well-developed and well-nourished. No distress.  HENT:  Head: Normocephalic and atraumatic.  Eyes: Conjunctivae are normal. Pupils are equal, round, and reactive to light.  Cardiovascular: Normal rate and regular rhythm.   Pulmonary/Chest: Effort normal.  Neurological: He is alert.  Skin: Skin is warm  and dry. He is not diaphoretic.  Nickel-sized scalp mass on the right for head. Mass is compressible, non-tender, and easily movable. No surrounding erythema or other signs of cellulitis. No fluctuance or induration to imply an abscess.  Nursing note and vitals reviewed.   ED Course  Procedures (including critical care time)  EMERGENCY DEPARTMENT US SOFT TISSUE INTERPRETATION "Study: Limited Ultrasound of the noted body part in comments below"  INDICATIONS: Other (refer to comments) Multiple views of the body part are obtained with a multi-frequency linear probe  PERFORMED BY:  Myself  IMAGES ARCHIVED?: Yes  SIDE:Right   BODY PART:Other soft tisse (comment in note)  FINDINGS: Other Small lipoma or simple cyst identified  LIMITATIONS:  None  INTERPRETATION:  No abcess noted  COMMENT:  Scalp mass needing assessment to determine likelihood of abscess. Mass identified as either a lipoma or simple cysts. No area for I&D.   Imaging Review No results found. I have personally reviewed and evaluated these images as part of my medical decision-making.   EKG Interpretation None      MDM   Final diagnoses:  Scalp mass    John Bright presents with a scalp mass present for the last year and a half.  This mass is most likely a lipoma versus a simple cyst. Patient has no systemic complaints. Patient given referral to plastic surgery for removal. Patient was given return precautions. Patient voiced understanding of these instructions and is comfortable with discharge.  Lorayne Bender, PA-C 08/09/15 [phone]  Varney Biles, MD 08/09/15 1622

## 2021-10-14 ENCOUNTER — Emergency Department (HOSPITAL_BASED_OUTPATIENT_CLINIC_OR_DEPARTMENT_OTHER): Payer: Self-pay

## 2021-10-14 ENCOUNTER — Other Ambulatory Visit: Payer: Self-pay

## 2021-10-14 ENCOUNTER — Encounter (HOSPITAL_BASED_OUTPATIENT_CLINIC_OR_DEPARTMENT_OTHER): Payer: Self-pay | Admitting: Emergency Medicine

## 2021-10-14 ENCOUNTER — Emergency Department (HOSPITAL_BASED_OUTPATIENT_CLINIC_OR_DEPARTMENT_OTHER)
Admission: EM | Admit: 2021-10-14 | Discharge: 2021-10-14 | Disposition: A | Discharge status: Home / Self Care | Payer: Self-pay | Attending: Emergency Medicine | Admitting: Emergency Medicine

## 2021-10-14 DIAGNOSIS — W500XXA Accidental hit or strike by another person, initial encounter: Secondary | ICD-10-CM | POA: Insufficient documentation

## 2021-10-14 DIAGNOSIS — W19XXXA Unspecified fall, initial encounter: Secondary | ICD-10-CM

## 2021-10-14 DIAGNOSIS — Y9367 Activity, basketball: Secondary | ICD-10-CM | POA: Insufficient documentation

## 2021-10-14 DIAGNOSIS — R0782 Intercostal pain: Secondary | ICD-10-CM | POA: Insufficient documentation

## 2021-10-14 MED ORDER — IBUPROFEN 800 MG PO TABS
800.0000 mg | ORAL_TABLET | Freq: Once | ORAL | Status: AC
  Administered 2021-10-14: 800 mg via ORAL
  Filled 2021-10-14: qty 1

## 2021-10-14 MED ORDER — LIDOCAINE 5 % EX PTCH
1.0000 | MEDICATED_PATCH | CUTANEOUS | Status: DC
  Administered 2021-10-14: 1 via TRANSDERMAL
  Filled 2021-10-14: qty 1

## 2021-10-14 NOTE — Discharge Instructions (Signed)
Use the incentive spirometry over the next week.  Take ibuprofen 800 mg up to 3 times daily.  Return to the ED if you have worsening shortness of breath, new or concerning symptoms or chest pain. ?

## 2021-10-14 NOTE — ED Notes (Signed)
Pt reported to registration that he was having difficulty breathing. Pt reassessed in triage. V/s stable. ?

## 2021-10-14 NOTE — ED Provider Notes (Signed)
?[facility] EMERGENCY DEPARTMENT ?Provider Note ? ? ?CSN: [id number] ?Arrival date & time: 10/14/21  1148 ? ?  ? ?History ? ?Chief Complaint  ?Patient presents with  ? Fall  ? ? ?Mansour Zuckerman is a 41 y.o. male. ? ? ?Fall ? ? ? Patient presents with right rib pain.  Happened acutely this afternoon when he was playing basketball with another player, he fell forward and the player landed on the side right side of his ribs/back.  He did not his head or lose consciousness.  States the pain is constant, is worse with inspiration.  Been roughly the same throughout the day, he has tried 800 mg of Motrin with some improvement. ? ?Home Medications ?Prior to Admission medications   ?Not on File  ?   ? ?Allergies    ?Penicillins   ? ?Review of Systems   ?Review of Systems ? ?Physical Exam ?Updated Vital Signs ?BP (!) 129/91 (BP Location: Left Arm)   Pulse 62   Temp 97.8 ?F (36.6 ?C) (Oral)   Resp 17   Ht '5\' 8"'$  (1.727 m)   Wt 59 kg   SpO2 100%   BMI 19.77 kg/m?  ?Physical Exam ?Vitals and nursing note reviewed. Exam conducted with a chaperone present.  ?Constitutional:   ?   General: He is not in acute distress. ?   Appearance: Normal appearance.  ?HENT:  ?   Head: Normocephalic and atraumatic.  ?Eyes:  ?   General: No scleral icterus. ?   Extraocular Movements: Extraocular movements intact.  ?   Pupils: Pupils are equal, round, and reactive to light.  ?Cardiovascular:  ?   Rate and Rhythm: Normal rate and regular rhythm.  ?Pulmonary:  ?   Effort: Pulmonary effort is normal.  ?   Breath sounds: Normal breath sounds.  ?   Comments: Speaking complete sentences, lungs are clear to auscultation bilaterally with lung sounds present in all lung fields. ?Chest:  ?   Chest wall: No crepitus.  ?Musculoskeletal:     ?   General: Tenderness present.  ?     Arms: ? ?   Comments: No crepitus, tenderness with palpation to right ribs  ?Skin: ?   Capillary Refill: Capillary refill takes less than 2 seconds.  ?   Coloration:  Skin is not jaundiced.  ?Neurological:  ?   Mental Status: He is alert. Mental status is at baseline.  ?   Coordination: Coordination normal.  ? ? ?ED Results / Procedures / Treatments   ?Labs ?(all labs ordered are listed, but only abnormal results are displayed) ?Labs Reviewed - No data to display ? ?EKG ?None ? ?Radiology ?DG Ribs Unilateral W/Chest Right ? ?Result Date: 10/14/2021 ?CLINICAL DATA:  41 year old male with history of right-sided rib pain after colliding with another player while playing basketball. EXAM: RIGHT RIBS AND CHEST - 3+ VIEW COMPARISON:  Chest x-ray 10/15/2011. FINDINGS: Lung volumes are normal. No consolidative airspace disease. No pleural effusions. No pneumothorax. No pulmonary nodule or mass noted. Pulmonary vasculature and the cardiomediastinal silhouette are within normal limits. Dedicated views of the right ribs demonstrate no definite acute displaced right-sided rib fractures. IMPRESSION: 1. No acute displaced right-sided rib fractures. 2. No pneumothorax or other findings to suggest significant acute traumatic injury to the thorax. Electronically Signed   By: Vinnie Langton M.D.   On: 10/14/2021 13:10   ? ?Procedures ?Procedures  ? ? ?Medications Ordered in ED ?Medications  ?ibuprofen (ADVIL) tablet 800 mg (800 mg  Oral Given 10/14/21 1428)  ? ? ?ED Course/ Medical Decision Making/ A&P ?  ?                        ?Medical Decision Making ?Amount and/or Complexity of Data Reviewed ?Radiology: ordered. ? ?Risk ?Prescription drug management. ? ? ?Patient presents due to fall and right rib/upper back pain.  Physical exam is overall reassuring, there is point tenderness that is reproducible with movement and palpation.  No crepitus, no obvious contusions and breath sounds are present bilaterally.  I ordered, viewed and interpreted the x-ray which did not show any acute pneumothorax, pleural effusion or rib fractures. ? ?Considered CT chest to assess for further trauma, however patient  on my exam, the patient's age and the location of the pain tenderness, I doubt acute intrathoracic trauma requiring further imaging at this time. ? ?I ordered the patient a Lidoderm patch incentive spirometer.  Encouraged to continue using the ibuprofen. ? ? ? ? ? ? ? ?Final Clinical Impression(s) / ED Diagnoses ?Final diagnoses:  ?Fall, initial encounter  ? ? ?Rx / DC Orders ?ED Discharge Orders   ? ? None  ? ?  ? ? ?  ?Sherrill Raring, PA-C ?10/14/21 2248 ? ?  ?Charlesetta Shanks, MD ?10/15/21 1949 ? ?

## 2021-10-14 NOTE — ED Triage Notes (Signed)
Pt arrives pov, to triage in wheelchair with c/o right side rib pain and difficulty with deep inspiration after colliding with another person that then fell on patient while playing basketball. Pt denies CP. Pt able to speak in complete sentences ?

## 2023-02-08 IMAGING — DX DG RIBS W/ CHEST 3+V*R*
3 series · 3 of 3 positions shown · non-contrast
Comparison: Chest x-ray 10/15/2011.

CLINICAL DATA: 40-year-old male with history of right-sided rib
pain after colliding with another player while playing basketball.

EXAM:
RIGHT RIBS AND CHEST - 3+ VIEW

[chest pa]
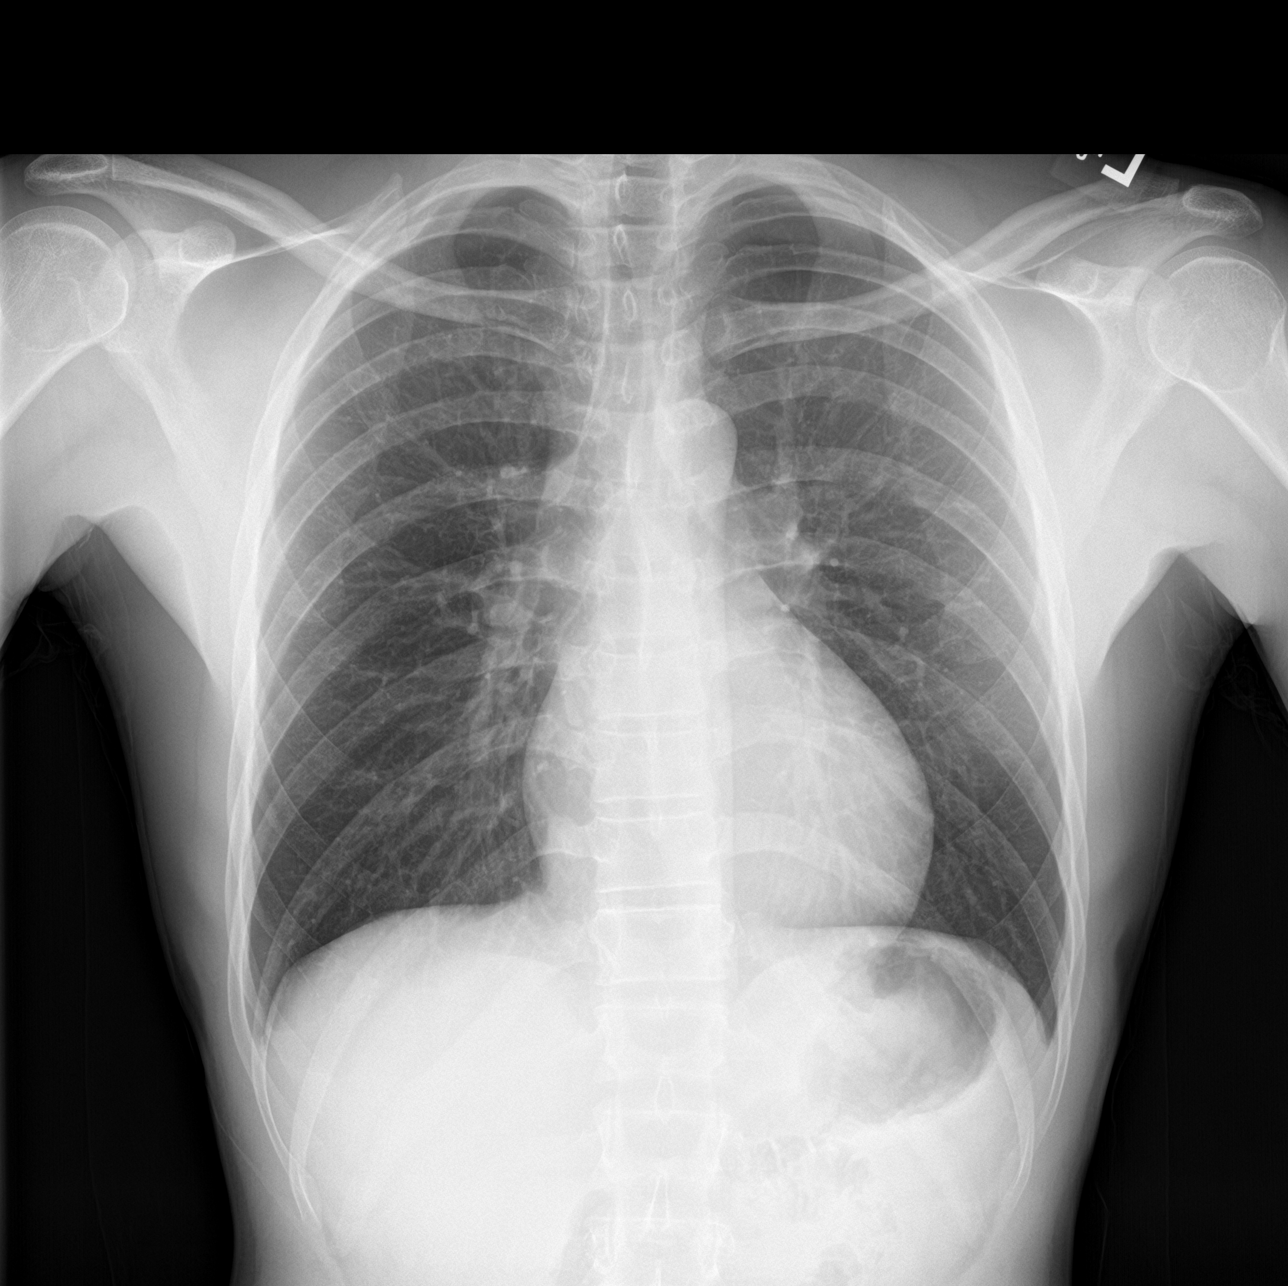

[rib pa]
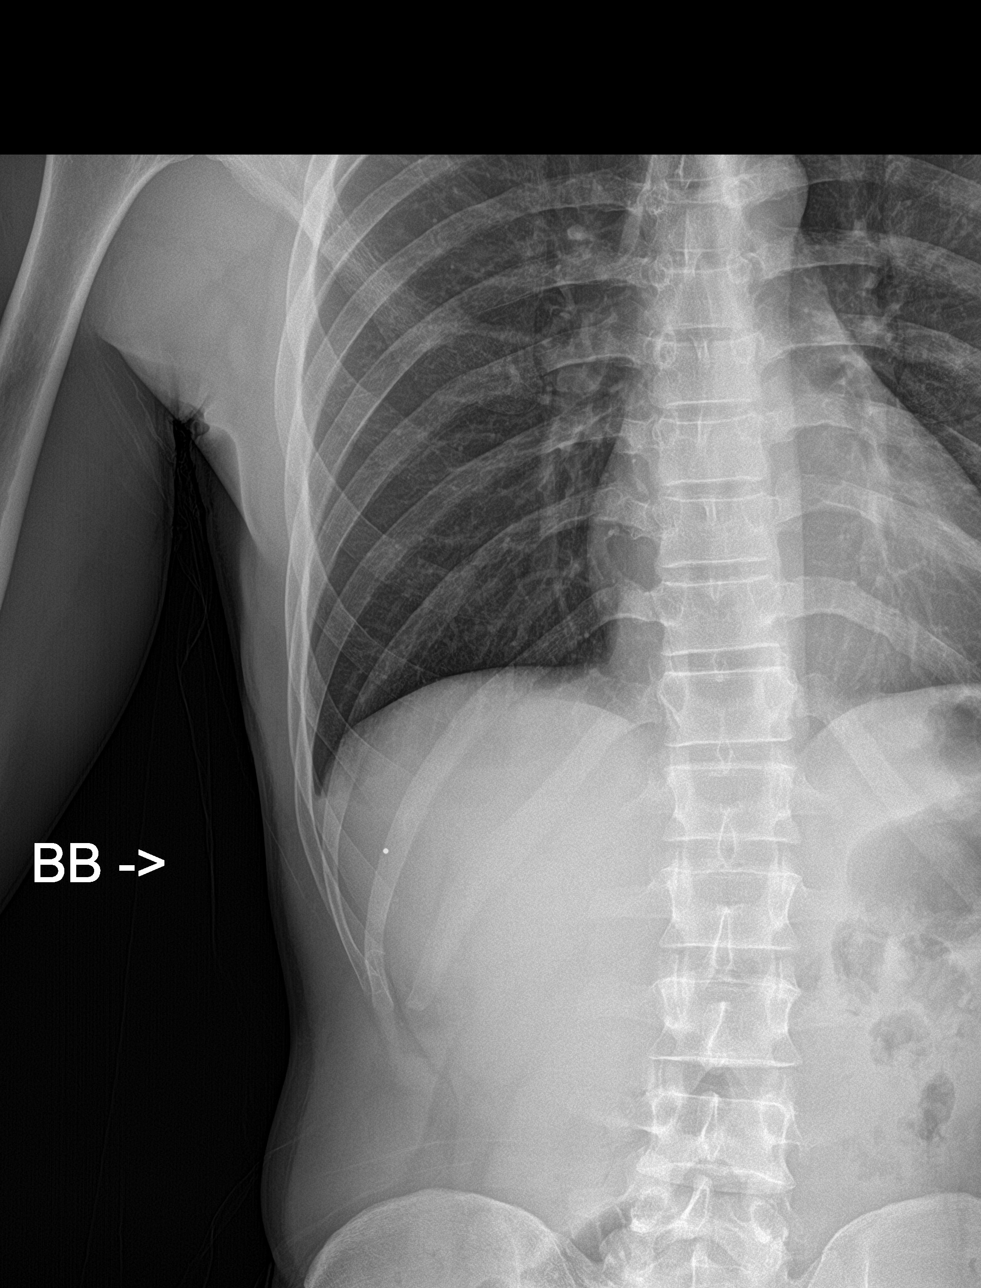

[rib pa obl]
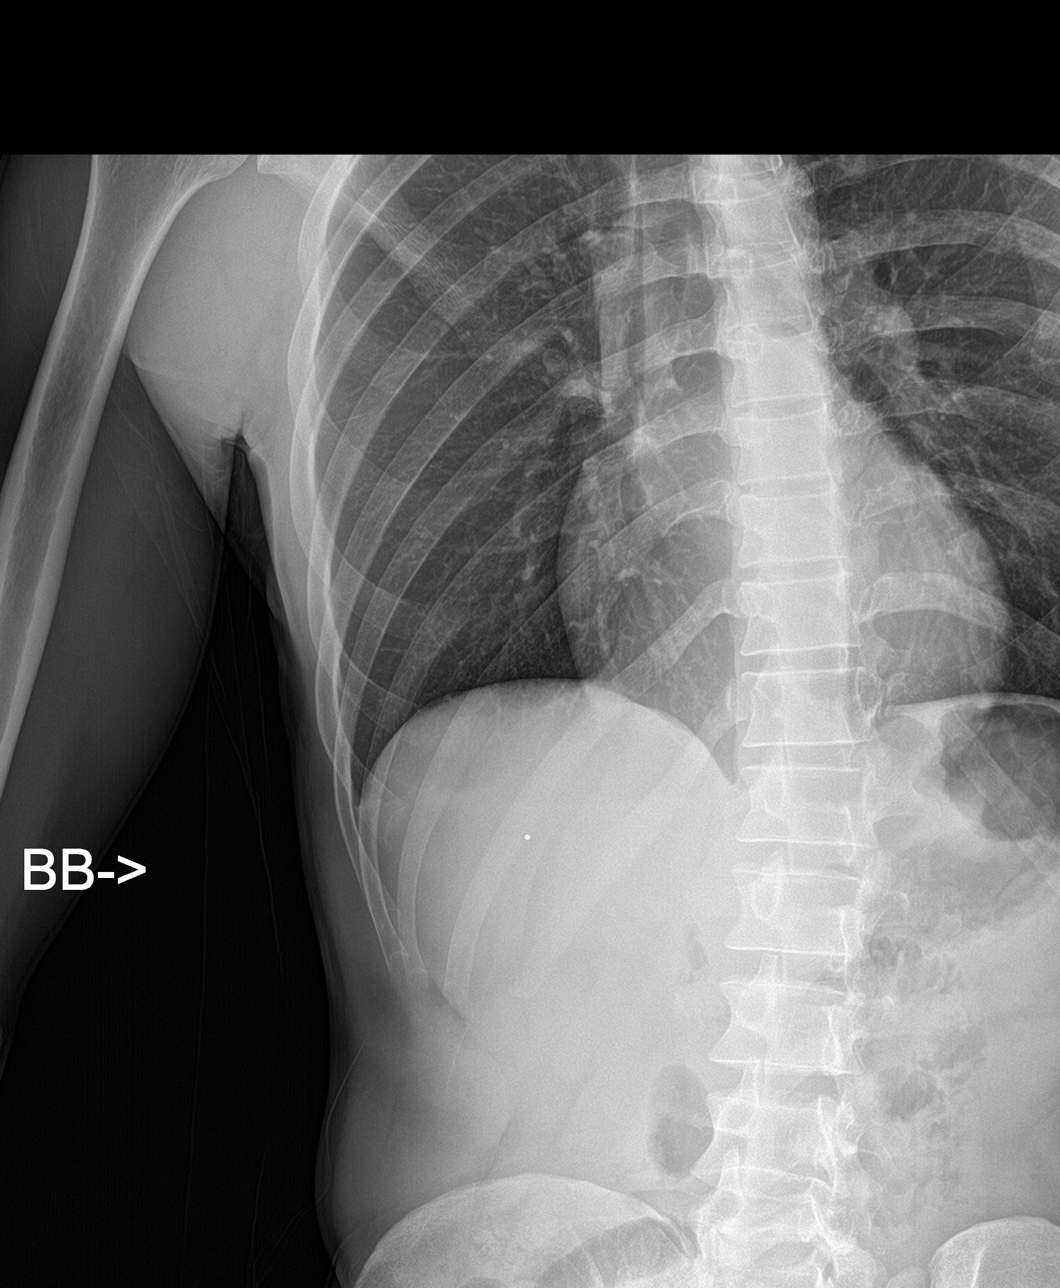

[3 of 3 positions shown; findings below may reference images not displayed]

FINDINGS: Lung volumes are normal. No consolidative airspace disease. No
pleural effusions. No pneumothorax. No pulmonary nodule or mass
noted. Pulmonary vasculature and the cardiomediastinal silhouette
are within normal limits.

Dedicated views of the right ribs demonstrate no definite acute
displaced right-sided rib fractures.
IMPRESSION: 1. No acute displaced right-sided rib fractures.
2. No pneumothorax or other findings to suggest significant acute
traumatic injury to the thorax.

## 2023-12-17 ENCOUNTER — Encounter (HOSPITAL_BASED_OUTPATIENT_CLINIC_OR_DEPARTMENT_OTHER): Payer: Self-pay

## 2023-12-17 ENCOUNTER — Other Ambulatory Visit: Payer: Self-pay

## 2023-12-17 ENCOUNTER — Emergency Department (HOSPITAL_BASED_OUTPATIENT_CLINIC_OR_DEPARTMENT_OTHER): Payer: Self-pay

## 2023-12-17 ENCOUNTER — Emergency Department (HOSPITAL_BASED_OUTPATIENT_CLINIC_OR_DEPARTMENT_OTHER)
Admission: EM | Admit: 2023-12-17 | Discharge: 2023-12-17 | Disposition: A | Discharge status: Home / Self Care | Payer: Self-pay | Attending: Emergency Medicine | Admitting: Emergency Medicine

## 2023-12-17 DIAGNOSIS — E876 Hypokalemia: Secondary | ICD-10-CM | POA: Insufficient documentation

## 2023-12-17 DIAGNOSIS — D72819 Decreased white blood cell count, unspecified: Secondary | ICD-10-CM | POA: Diagnosis not present

## 2023-12-17 DIAGNOSIS — G43909 Migraine, unspecified, not intractable, without status migrainosus: Secondary | ICD-10-CM | POA: Diagnosis not present

## 2023-12-17 DIAGNOSIS — R519 Headache, unspecified: Secondary | ICD-10-CM | POA: Diagnosis present

## 2023-12-17 LAB — CBC WITH DIFFERENTIAL/PLATELET
Abs Immature Granulocytes: 0 10*3/uL (ref 0.00–0.07)
Basophils Absolute: 0 10*3/uL (ref 0.0–0.1)
Basophils Relative: 1 %
Eosinophils Absolute: 0 10*3/uL (ref 0.0–0.5)
Eosinophils Relative: 0 %
HCT: 39.6 % (ref 39.0–52.0)
Hemoglobin: 13.2 g/dL (ref 13.0–17.0)
Immature Granulocytes: 0 %
Lymphocytes Relative: 48 %
Lymphs Abs: 1.6 10*3/uL (ref 0.7–4.0)
MCH: 29.7 pg (ref 26.0–34.0)
MCHC: 33.3 g/dL (ref 30.0–36.0)
MCV: 89.2 fL (ref 80.0–100.0)
Monocytes Absolute: 0.6 10*3/uL (ref 0.1–1.0)
Monocytes Relative: 16 %
Neutro Abs: 1.2 10*3/uL — ABNORMAL LOW (ref 1.7–7.7)
Neutrophils Relative %: 35 %
Platelets: 275 10*3/uL (ref 150–400)
RBC: 4.44 MIL/uL (ref 4.22–5.81)
RDW: 11.9 % (ref 11.5–15.5)
WBC: 3.5 10*3/uL — ABNORMAL LOW (ref 4.0–10.5)
nRBC: 0 % (ref 0.0–0.2)

## 2023-12-17 LAB — COMPREHENSIVE METABOLIC PANEL WITH GFR
ALT: 10 U/L (ref 0–44)
AST: 21 U/L (ref 15–41)
Albumin: 4.1 g/dL (ref 3.5–5.0)
Alkaline Phosphatase: 81 U/L (ref 38–126)
Anion gap: 11 (ref 5–15)
BUN: 13 mg/dL (ref 6–20)
CO2: 29 mmol/L (ref 22–32)
Calcium: 9.1 mg/dL (ref 8.9–10.3)
Chloride: 99 mmol/L (ref 98–111)
Creatinine, Ser: 1.26 mg/dL — ABNORMAL HIGH (ref 0.61–1.24)
GFR, Estimated: >60 mL/min (ref >60)
Glucose, Bld: 83 mg/dL (ref 70–99)
Potassium: 3.3 mmol/L — ABNORMAL LOW (ref 3.5–5.1)
Sodium: 139 mmol/L (ref 135–145)
Total Bilirubin: 0.5 mg/dL (ref 0.0–1.2)
Total Protein: 7.1 g/dL (ref 6.5–8.1)

## 2023-12-17 LAB — RESP PANEL BY RT-PCR (RSV, FLU A&B, COVID)  RVPGX2
Influenza A by PCR: NEGATIVE
Influenza B by PCR: NEGATIVE
Resp Syncytial Virus by PCR: NEGATIVE
SARS Coronavirus 2 by RT PCR: NEGATIVE

## 2023-12-17 MED ORDER — POTASSIUM CHLORIDE CRYS ER 20 MEQ PO TBCR
40.0000 meq | EXTENDED_RELEASE_TABLET | Freq: Once | ORAL | Status: AC
  Administered 2023-12-17: 40 meq via ORAL
  Filled 2023-12-17: qty 2

## 2023-12-17 MED ORDER — PROCHLORPERAZINE EDISYLATE 10 MG/2ML IJ SOLN
10.0000 mg | Freq: Once | INTRAMUSCULAR | Status: AC
  Administered 2023-12-17: 10 mg via INTRAVENOUS
  Filled 2023-12-17: qty 2

## 2023-12-17 MED ORDER — DIPHENHYDRAMINE HCL 50 MG/ML IJ SOLN
12.5000 mg | Freq: Once | INTRAMUSCULAR | Status: AC
  Administered 2023-12-17: 12.5 mg via INTRAVENOUS
  Filled 2023-12-17: qty 1

## 2023-12-17 MED ORDER — SODIUM CHLORIDE 0.9 % IV BOLUS
1000.0000 mL | Freq: Once | INTRAVENOUS | Status: AC
Start: 2023-12-17 — End: 2023-12-17
  Administered 2023-12-17: 1000 mL via INTRAVENOUS

## 2023-12-17 NOTE — Discharge Instructions (Addendum)
 You were seen in the ER today for concerns of a headache. Your labs and CT scan of your head were thankfully negative. I suspect your headaches have been due to migraines. Please follow up with your primary care provider if your headaches persist. For any concerns of new or worsening symptoms, return to the ER.

## 2023-12-17 NOTE — ED Triage Notes (Signed)
 Arrives POV with complaints of headache x1 week with no relief with OTC meds. Rates pain a 3/10. Reports fatigue as well

## 2023-12-17 NOTE — ED Provider Notes (Signed)
 [facility] Provider Note   CSN: [id number] Arrival date & time: 12/17/23  1354     Patient presents with: Headache   John Bright is a 43 y.o. male.  Patient with significant medical history presents to the emergency department concerns of a headache.  Reports a posterior headache for the last week with no improvement with any over-the-counter medications.  Currently rates headache at 3 out of 10.  Endorses some feelings of generalized fatigue but denies any cough, congestion, shortness of breath, fevers, recent sick contacts.  No prior history of migraine headaches.  Denies any feelings of visual changes.    Headache      Prior to Admission medications   Not on File    Allergies: Penicillins    Review of Systems  Neurological:  Positive for headaches.  All other systems reviewed and are negative.   Updated Vital Signs BP 109/74   Pulse 63   Temp 98.2 F (36.8 C) (Oral)   Resp 16   Ht 5' 8 (1.727 m)   Wt 59 kg   SpO2 98%   BMI 19.77 kg/m   Physical Exam Vitals and nursing note reviewed.  Constitutional:      General: He is not in acute distress.    Appearance: He is well-developed.  HENT:     Head: Normocephalic and atraumatic.   Eyes:     Conjunctiva/sclera: Conjunctivae normal.    Cardiovascular:     Rate and Rhythm: Normal rate and regular rhythm.     Heart sounds: No murmur heard. Pulmonary:     Effort: Pulmonary effort is normal. No respiratory distress.     Breath sounds: Normal breath sounds.  Abdominal:     Palpations: Abdomen is soft.     Tenderness: There is no abdominal tenderness.   Musculoskeletal:        General: No swelling.     Cervical back: Neck supple.   Skin:    General: Skin is warm and dry.     Capillary Refill: Capillary refill takes less than 2 seconds.   Neurological:     Mental Status: He is alert.     Cranial Nerves: No cranial nerve deficit or facial asymmetry.      Sensory: No sensory deficit.     Motor: No weakness.     Deep Tendon Reflexes: Reflexes normal.   Psychiatric:        Mood and Affect: Mood normal.     (all labs ordered are listed, but only abnormal results are displayed) Labs Reviewed  CBC WITH DIFFERENTIAL/PLATELET - Abnormal; Notable for the following components:      Result Value   WBC 3.5 (*)    Neutro Abs 1.2 (*)    All other components within normal limits  COMPREHENSIVE METABOLIC PANEL WITH GFR - Abnormal; Notable for the following components:   Potassium 3.3 (*)    Creatinine, Ser 1.26 (*)    All other components within normal limits  RESP PANEL BY RT-PCR (RSV, FLU A&B, COVID)  [id number]    EKG: None  Radiology: CT Head Wo Contrast Result Date: 12/17/2023 CLINICAL DATA:  Headache EXAM: CT HEAD WITHOUT CONTRAST TECHNIQUE: Contiguous axial images were obtained from the base of the skull through the vertex without intravenous contrast. RADIATION DOSE REDUCTION: This exam was performed according to the departmental dose-optimization program which includes automated exposure control, adjustment of the mA and/or kV according to patient size and/or use of iterative  reconstruction technique. COMPARISON:  None Available. FINDINGS: Brain: No acute intracranial abnormality. Specifically, no hemorrhage, hydrocephalus, mass lesion, acute infarction, or significant intracranial injury. Vascular: No hyperdense vessel or unexpected calcification. Skull: No acute calvarial abnormality. Sinuses/Orbits: No acute findings Other: None IMPRESSION: No acute intracranial abnormality. Electronically Signed   By: Franky Crease M.D.   On: 12/17/2023 17:12     Procedures   Medications Ordered in the ED  sodium chloride 0.9 % bolus 1,000 mL (0 mLs Intravenous Stopped 12/17/23 1738)  prochlorperazine (COMPAZINE) injection 10 mg (10 mg Intravenous Given 12/17/23 1620)  diphenhydrAMINE (BENADRYL) injection 12.5 mg (12.5 mg Intravenous Given 12/17/23 1620)   potassium chloride SA (KLOR-CON M) CR tablet 40 mEq (40 mEq Oral Given 12/17/23 1742)                                    Medical Decision Making Amount and/or Complexity of Data Reviewed Labs: ordered. Radiology: ordered.  Risk Prescription drug management.   This patient presents to the ED for concern of headache, this involves an extensive number of treatment options, and is a complaint that carries with it a high risk of complications and morbidity.  The differential diagnosis includes migraine headache, tension headache, intracranial mass, encephalopathy   Co morbidities that complicate the patient evaluation  None   Lab Tests:  I Ordered, and personally interpreted labs.  The pertinent results include: CBC with mild leukopenia 3.5, CMP with hypokalemia 3.3, no other acute abnormalities, respiratory panel negative   Imaging Studies ordered:  I ordered imaging studies including CT head I independently visualized and interpreted imaging which showed negative for any intracranial mildly I agree with the radiologist interpretation   Consultations Obtained:  I requested consultation with none,  and discussed lab and imaging findings as well as pertinent plan - they recommend: N/A   Problem List / ED Course / Critical interventions / Medication management  Patient presents to the emergency department today with concerns of a headache.  Reports his headache has been ongoing for the last week.  No prior history of migraine headaches.  Has tried over-the-counter medications without improvement or resolution of symptoms.  Has endorsed some mild nausea but denies any vomiting.  No visual disturbance.  Has not ever been evaluated for migraines by a primary care provider or in any other setting. On exam, patient appears to have mild sensitivity to light.  No focal neurological deficits such as facial droop, slurred speech, unilateral weakness, or any other concerns.  Normal heart  and lung sounds.  Unremarkable ENT exam.  Given no prior history of headaches, basic labs and CT ordered for evaluation.  Migraine cocktail initiated including fluids, Benadryl, and Compazine. Workup reassuring.  Mild hypokalemia seen but no other acute findings. On reassessment, patient reports resolution of his headache.  Suspect this likely was a migraine headache.  Advise close follow-up with PCP and potential follow-up with neurology if headaches become more persistent or more uncomfortable.  Strict return precautions discussed.  Discharged home in stable condition. I ordered medication including Compazine, Benadryl, fluid bolus, potassium for migraine cocktail, hypokalemia Reevaluation of the patient after these medicines showed that the patient improved I have reviewed the patients home medicines and have made adjustments as needed   Test / Admission - Considered:  Admission considered but given resolution of headache and no other findings on workup, patient stable for discharge.  Final diagnoses:  Migraine without status migrainosus, not intractable, unspecified migraine type    ED Discharge Orders     None          Cecily Legrand DELENA DEVONNA 12/17/23 1806    Jerrol Agent, MD 12/17/23 [phone]
